# Patient Record
Sex: Male | Born: 2005
Health system: Southern US, Community
[De-identification: ages and names within clinical notes are randomized; demographics above are authoritative.]

## PROBLEM LIST (undated history)

## (undated) DIAGNOSIS — F909 Attention-deficit hyperactivity disorder, unspecified type: Secondary | ICD-10-CM

## (undated) DIAGNOSIS — J45909 Unspecified asthma, uncomplicated: Secondary | ICD-10-CM

## (undated) DIAGNOSIS — J302 Other seasonal allergic rhinitis: Secondary | ICD-10-CM

## (undated) DIAGNOSIS — T7840XA Allergy, unspecified, initial encounter: Secondary | ICD-10-CM

## (undated) HISTORY — PX: HYPOSPADIAS CORRECTION: SHX483

## (undated) HISTORY — DX: Allergy, unspecified, initial encounter: T78.40XA

## (undated) HISTORY — DX: Unspecified asthma, uncomplicated: J45.909

---
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
## 2023-04-16 ENCOUNTER — Ambulatory Visit: Payer: 59 | Admitting: Family Medicine

## 2023-04-22 ENCOUNTER — Encounter (INDEPENDENT_AMBULATORY_CARE_PROVIDER_SITE_OTHER): Payer: Self-pay

## 2023-06-01 ENCOUNTER — Other Ambulatory Visit (HOSPITAL_COMMUNITY)
Admission: RE | Admit: 2023-06-01 | Discharge: 2023-06-01 | Disposition: A | Discharge status: Home / Self Care | Payer: 59 | Source: Ambulatory Visit | Attending: Family Medicine | Admitting: Family Medicine

## 2023-06-01 ENCOUNTER — Ambulatory Visit: Payer: 59 | Admitting: Family Medicine

## 2023-06-01 ENCOUNTER — Encounter: Payer: Self-pay | Admitting: Family Medicine

## 2023-06-01 VITALS — BP 110/70 | HR 61 | Temp 97.6°F | Ht 67.1 in | Wt 209.6 lb

## 2023-06-01 DIAGNOSIS — Z118 Encounter for screening for other infectious and parasitic diseases: Secondary | ICD-10-CM | POA: Insufficient documentation

## 2023-06-01 DIAGNOSIS — R413 Other amnesia: Secondary | ICD-10-CM

## 2023-06-01 DIAGNOSIS — Z113 Encounter for screening for infections with a predominantly sexual mode of transmission: Secondary | ICD-10-CM

## 2023-06-01 DIAGNOSIS — R6889 Other general symptoms and signs: Secondary | ICD-10-CM

## 2023-06-01 DIAGNOSIS — Z114 Encounter for screening for human immunodeficiency virus [HIV]: Secondary | ICD-10-CM

## 2023-06-01 NOTE — Patient Instructions (Addendum)
Please stop by lab before you go If you have mychart- we will send your results within 3 business days of [place] receiving them.  If you do not have mychart- we will call you about results within 5 business days of [place] receiving them.  *please also note that you will see labs on mychart as soon as they post. I will later go in and write notes on them- will say "notes from Dr. Durene Cal"   We have placed a referral for you today to [facility] ophthalmology (please confirm with your insurance that they are covered). In some cases you will see # listed below- you can call this if you have not heard within a week. If you do not see # listed- you should receive a mychart message or phone call within a week with the # to call directly- call that as soon as you get it. If you are having issues getting scheduled reach out to [place] again.   Recommended follow up: Return for next already scheduled visit or sooner if needed.

## 2023-06-01 NOTE — Addendum Note (Signed)
Addended by: Shelva Majestic on: 06/01/2023 04:08 PM   Modules accepted: Orders

## 2023-06-01 NOTE — Progress Notes (Addendum)
Phone [phone] In person visit   Subjective:   Jonathan Robertson is a 17 y.o. year old very pleasant male patient who presents for/with See problem oriented charting Chief Complaint  Patient presents with   memory issues    Pt c/o short term memory loss    Past Medical History-  Patient Active Problem List   Diagnosis Date Noted   ADHD (attention deficit hyperactivity disorder)     Priority: High   Asthma     Priority: Medium    Seasonal allergies     Priority: Low    Medications- reviewed and updated Current Outpatient Medications  Medication Sig Dispense Refill   ASHWAGANDHA PO Take by mouth.     Cholecalciferol (VITAMIN D3) 25 MCG (1000 UT) CAPS Take by mouth.     Magnesium 400 MG CAPS Take by mouth.     EPINEPHrine 0.3 mg/0.3 mL IJ SOAJ injection Inject 0.3 mg into the muscle as needed for anaphylaxis (for been stings). (Patient not taking: Reported on 06/01/2023) 2 each 1   No current facility-administered medications for this visit.     Objective:  BP 110/70   Pulse 61   Temp 97.6 F (36.4 C)   Ht 5' 7.1" (1.704 m)   Wt 209 lb 9.6 oz (95.1 kg)   SpO2 96%   BMI 32.73 kg/m  Gen: NAD, resting comfortably CV: RRR no murmurs rubs or gallops Lungs: CTAB no crackles, wheeze, rhonchi Ext: no edema Skin: warm, dry Neuro: CN II-XII intact, sensation and reflexes normal throughout, 5/5 muscle strength in bilateral upper and lower extremities. Normal finger to nose. Normal rapid alternating movements. No pronator drift. Normal romberg. Normal gait.      Assessment and Plan   #social update- breakup about 2.5 months ago with girlfriend was hard but he is doing better now  # Memory loss S: patient complains of ongoing issues with short term memory loss  - he does have known but untreated attention deficit disorder -denies issues with long term memory. Usually placing an object down and forgetting where or forgetting where he was in a specific task (like walking  into a room) -takes B12 - noted issues a few years ago but didn't remember to say anything. Stable without worsening. Has also been off add medications for a few years - RPR and HIV negative last visit -General Anxiety Disorder - 7 question screening survey of 14- some stress could contribute. Phq2 of 0.   -No regular issues with any of the following-  facial or extremity weakness. No slurred words or trouble swallowing. no blurry vision or double vision. No paresthesias. No confusion or word finding difficulties. No headache(s)   A/P: some memory loss issues- reassuring neurological exam and minicog with normal clock draw and 2/3 recall - normal screening on memory. Likely due to underlying attention deficit disorder as far as memory concerns but we will update labs -hold off on MRI of the brain- think likely low yield at his age unless progression.    # Obesity- peak 247 on home scales  S:got as low as 201- exercise 5 days a week and has tried to eat better  Wt Readings from Last 3 Encounters:  06/01/23 209 lb 9.6 oz (95.1 kg) (95%, Z= 1.68)*  09/21/22 216 lb 6.4 oz (98.2 kg) (97%, Z= 1.87)*  04/03/22 222 lb 12.8 oz (101.1 kg) (98%, Z= 2.04)*   * Growth percentiles are based on CDC (Boys, 2-20 Years) data.  A/P: obesity improving-  congratulated his efforts and encouraged him to continue!   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   -recommended getting his eyes evaluated with eye doctor as first step with changes in sensitivity  Recommended follow up: Return for next already scheduled visit or sooner if needed. Future Appointments  Date Time Provider Department Center  09/23/2023 10:00 AM Shelva Majestic, MD [facility]   Lab/Order associations:   ICD-10-CM   1. Memory loss  R41.3 Vitamin B12    TSH    2. Light sensitivity  [id number] Ambulatory referral to Ophthalmology    3. Screening for HIV  (human immunodeficiency virus)  Z11.4 HIV Antibody (routine testing w rflx)    4. Screening examination for venereal disease  Z11.3 RPR    5. Screening for gonorrhea  Z11.3 Urine cytology ancillary only    6. Screening for chlamydial disease  Z11.8 Urine cytology ancillary only     Time Spent: 22 minutes of total time (3:44 PM- 4:06 PM) was spent on the date of the encounter performing the following actions:  obtaining history, performing a medically necessary exam, counseling on the treatment plan and discussing how attention deficit disorder can affect memory, placing orders, and documenting in our EHR.   No orders of the defined types were placed in this encounter.   Return precautions advised.  Tana Conch, MD

## 2023-06-02 LAB — TSH: TSH: 1.68 u[IU]/mL (ref 0.40–5.00)

## 2023-06-02 LAB — RPR: RPR Ser Ql: NONREACTIVE

## 2023-06-02 LAB — VITAMIN B12: Vitamin B-12: 805 pg/mL (ref 211–911)

## 2023-06-02 LAB — HIV ANTIBODY (ROUTINE TESTING W REFLEX): HIV 1&2 Ab, 4th Generation: NONREACTIVE

## 2023-06-03 LAB — URINE CYTOLOGY ANCILLARY ONLY
Chlamydia: NEGATIVE
Comment: NEGATIVE
Comment: NEGATIVE
Comment: NORMAL
Neisseria Gonorrhea: NEGATIVE
Trichomonas: NEGATIVE

## 2023-09-23 ENCOUNTER — Encounter: Payer: BLUE CROSS/BLUE SHIELD | Admitting: Family Medicine

## 2023-10-07 ENCOUNTER — Other Ambulatory Visit: Payer: Self-pay

## 2023-10-07 ENCOUNTER — Emergency Department (HOSPITAL_BASED_OUTPATIENT_CLINIC_OR_DEPARTMENT_OTHER)
Admission: EM | Admit: 2023-10-07 | Discharge: 2023-10-08 | Disposition: A | Discharge status: Home / Self Care | Payer: BLUE CROSS/BLUE SHIELD | Attending: Emergency Medicine | Admitting: Emergency Medicine

## 2023-10-07 ENCOUNTER — Emergency Department (HOSPITAL_BASED_OUTPATIENT_CLINIC_OR_DEPARTMENT_OTHER): Payer: BLUE CROSS/BLUE SHIELD | Admitting: Radiology

## 2023-10-07 ENCOUNTER — Encounter (HOSPITAL_BASED_OUTPATIENT_CLINIC_OR_DEPARTMENT_OTHER): Payer: Self-pay | Admitting: Emergency Medicine

## 2023-10-07 DIAGNOSIS — Z20822 Contact with and (suspected) exposure to covid-19: Secondary | ICD-10-CM | POA: Diagnosis not present

## 2023-10-07 DIAGNOSIS — Z572 Occupational exposure to dust: Secondary | ICD-10-CM | POA: Diagnosis not present

## 2023-10-07 DIAGNOSIS — R519 Headache, unspecified: Secondary | ICD-10-CM | POA: Diagnosis not present

## 2023-10-07 DIAGNOSIS — R059 Cough, unspecified: Secondary | ICD-10-CM | POA: Insufficient documentation

## 2023-10-07 LAB — RESP PANEL BY RT-PCR (RSV, FLU A&B, COVID)  RVPGX2
Influenza A by PCR: NEGATIVE
Influenza B by PCR: NEGATIVE
Resp Syncytial Virus by PCR: NEGATIVE
SARS Coronavirus 2 by RT PCR: NEGATIVE

## 2023-10-07 MED ORDER — AEROCHAMBER PLUS FLO-VU LARGE MISC
1.0000 | Freq: Once | Status: AC
  Administered 2023-10-08: 1

## 2023-10-07 MED ORDER — ALBUTEROL SULFATE HFA 108 (90 BASE) MCG/ACT IN AERS
1.0000 | INHALATION_SPRAY | Freq: Once | RESPIRATORY_TRACT | Status: AC
  Administered 2023-10-08: 2 via RESPIRATORY_TRACT
  Filled 2023-10-07: qty 6.7

## 2023-10-07 NOTE — ED Provider Notes (Signed)
[place] EMERGENCY DEPARTMENT AT [facility] Provider Note   CSN: [id number] Arrival date & time: 10/07/23  2221     History  Chief Complaint  Patient presents with   Cough    Jonathan Robertson is a 18 y.o. male presents today for cough, headaches, and loss of smell since last Friday.  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  Patient endorses coughing gray tinge mucus.  Patient denies fever, shortness of breath, fever, nausea, vomiting, abdominal pain, or chest pain.   Cough Associated symptoms: headaches        Home Medications Prior to Admission medications   Medication Sig Start Date End Date Taking? Authorizing Provider  ASHWAGANDHA PO Take by mouth.    [provider]  Cholecalciferol (VITAMIN D3) 25 MCG (1000 UT) CAPS Take by mouth.    [provider]  EPINEPHrine 0.3 mg/0.3 mL IJ SOAJ injection Inject 0.3 mg into the muscle as needed for anaphylaxis (for been stings). Patient not taking: Reported on 06/01/2023 03/07/21   Shelva Majestic, MD  Magnesium 400 MG CAPS Take by mouth.    [provider]      Allergies    Patient has no known allergies.    Review of Systems   Review of Systems  Respiratory:  Positive for cough.   Neurological:  Positive for headaches.    Physical Exam Updated Vital Signs There were no vitals taken for this visit. Physical Exam Vitals and nursing note reviewed.  Constitutional:      General: He is not in acute distress.    Appearance: Normal appearance. He is well-developed. He is not ill-appearing or toxic-appearing.  HENT:     Head: Normocephalic and atraumatic.     Right Ear: External ear normal.     Left Ear: External ear normal.     Nose: Congestion present. No rhinorrhea.     Mouth/Throat:     Mouth: Mucous membranes are moist.     Pharynx: Oropharynx is clear.  Eyes:     Extraocular Movements: Extraocular  movements intact.     Conjunctiva/sclera: Conjunctivae normal.  Cardiovascular:     Rate and Rhythm: Normal rate and regular rhythm.     Pulses: Normal pulses.     Heart sounds: Normal heart sounds. No murmur heard. Pulmonary:     Effort: Pulmonary effort is normal. No respiratory distress.     Breath sounds: Normal breath sounds.  Abdominal:     Palpations: Abdomen is soft.  Musculoskeletal:        General: No swelling.     Cervical back: Neck supple.  Skin:    General: Skin is warm and dry.     Capillary Refill: Capillary refill takes less than 2 seconds.  Neurological:     General: No focal deficit present.     Mental Status: He is alert.     Motor: No weakness.  Psychiatric:        Mood and Affect: Mood normal.     ED Results / Procedures / Treatments   Labs (all labs ordered are listed, but only abnormal results are displayed) Labs Reviewed  RESP PANEL BY RT-PCR (RSV, FLU A&B, COVID)  [id number]    EKG None  Radiology DG Chest 2 View Result Date: 10/07/2023 CLINICAL DATA:  Cough after exposure to metal debris EXAM: CHEST - 2 VIEW COMPARISON:  Chest x-ray 10/06/2017 FINDINGS: The heart  and mediastinal contours are within normal limits. No focal consolidation. No pulmonary edema. No pleural effusion. No pneumothorax. No acute osseous abnormality. IMPRESSION: No active cardiopulmonary disease. Electronically Signed   By: Tish Frederickson M.D.   On: 10/07/2023 23:02    Procedures Procedures    Medications Ordered in ED Medications  albuterol (VENTOLIN HFA) 108 (90 Base) MCG/ACT inhaler 1-2 puff (has no administration in time range)    ED Course/ Medical Decision Making/ A&P                                 Medical Decision Making Amount and/or Complexity of Data Reviewed Radiology: ordered.   This patient presents to the ED with chief complaint(s) of cough, headache, loss of smell with pertinent past medical history of asthma which further complicates the  presenting complaint. The complaint involves an extensive differential diagnosis and also carries with it a high risk of complications and morbidity.    The differential diagnosis includes COVID, flu, RSV, URI, pneumonia, asthma exacerbation, irritant induced airway reaction  Additional history obtained: Additional history obtained from significant other Records reviewed Primary Care Documents  ED Course and Reassessment: Patient given albuterol inhaler as patient has past medical history significant for asthma and does not currently have an inhaler  Independent labs interpretation:  The following labs were independently interpreted:  Respiratory panel: Negative  Independent visualization of imaging: - I independently visualized the following imaging with scope of interpretation limited to determining acute life threatening conditions related to emergency care: Chest x-ray, which revealed no active cardiopulmonary disease  Consultation: - Consulted or discussed management/test interpretation w/ external professional: None  Consideration for admission or further workup: Considered for mission further workup however patient vital signs, physical exam, labs, and imaging were reassuring.  Patient symptoms likely due to dust exposure at work causing airway irritation.  Patient encouraged to take an antihistamine over the upcoming weeks and follow-up with his primary care physician if his symptoms do not resolve.        Final Clinical Impression(s) / ED Diagnoses Final diagnoses:  Occupational exposure to dust    Rx / DC Orders ED Discharge Orders     None         Gretta Began 10/07/23 2352    Rondel Baton, MD 10/11/23 [phone]

## 2023-10-07 NOTE — ED Triage Notes (Signed)
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 Coughing since, headaches, loss of smell.

## 2023-10-07 NOTE — Discharge Instructions (Addendum)
Today you are seen for cough and headache likely due to dust exposure at your job.  Please take an antihistamine for the upcoming weeks to see your symptoms resolved. Please follow-up with your primary care if your symptoms do not resolve for further evaluation.  Thank you for letting us treat you today. After reviewing your labs and imaging, I feel you are safe to go home. Please follow up with your PCP in the next several days and provide them with your records from this visit. Return to the Emergency Room if pain becomes severe or symptoms worsen.

## 2023-10-08 DIAGNOSIS — R059 Cough, unspecified: Secondary | ICD-10-CM | POA: Diagnosis not present

## 2023-10-08 NOTE — ED Notes (Signed)
RT educated pt on proper use of MDI w/spacer. Pt able to perform w/out difficulty. Pt verbalizes understanding of dosing and administration instructions.     10/08/23 0002  Aerosol Therapy Tx  Medications Albuterol  Delivery Device MDI (spacer)  Pre-Treatment Pulse 77  Pre-Treatment Respirations 18  Treatment Tolerance Tolerated well  Treatment Given 1  MEWS Score/Color  MEWS Score 0  MEWS Score Color Green  Oxygen Therapy/Pulse Ox  SpO2 99 %

## 2023-10-18 ENCOUNTER — Encounter: Payer: Self-pay | Admitting: Family Medicine

## 2023-10-18 ENCOUNTER — Ambulatory Visit (INDEPENDENT_AMBULATORY_CARE_PROVIDER_SITE_OTHER): Payer: BLUE CROSS/BLUE SHIELD | Admitting: Family Medicine

## 2023-10-18 VITALS — BP 118/66 | HR 66 | Temp 98.4°F | Ht 69.0 in | Wt 210.8 lb

## 2023-10-18 DIAGNOSIS — J329 Chronic sinusitis, unspecified: Secondary | ICD-10-CM | POA: Diagnosis not present

## 2023-10-18 DIAGNOSIS — B9689 Other specified bacterial agents as the cause of diseases classified elsewhere: Secondary | ICD-10-CM

## 2023-10-18 MED ORDER — AMOXICILLIN-POT CLAVULANATE 875-125 MG PO TABS: 1.0000 | ORAL_TABLET | Freq: Two times a day (BID) | ORAL | 0 refills | Status: AC

## 2023-10-18 NOTE — Patient Instructions (Addendum)
 Health Maintenance Due  Topic Date Due   Pneumococcal Vaccine 22-20 Years old (1 of 2 - PCV) 03/11/2010  When you start feeling better  I was initially concerned that asthma may be flared but lungs are clear and he has not used albuterol  today- on exam nose looks irritated and he has sinus pressure and with ongoing blowing out discharge with over 2 weeks duration we opted to treat for bacterial sinusitis but if not significantly improved within a week he should let me know or sooner if symptoms worsen.    Recommended follow up: Return for as needed for new, worsening, persistent symptoms.

## 2023-10-18 NOTE — Progress Notes (Signed)
 Phone [phone] In person visit   Subjective:   Jonathan Robertson is a 18 y.o. year old very pleasant male patient who presents for/with See problem oriented charting Chief Complaint  Patient presents with   Follow-up   Past Medical History-  Patient Active Problem List   Diagnosis Date Noted   ADHD (attention deficit hyperactivity disorder)     Priority: High   Asthma     Priority: Medium    Seasonal allergies     Priority: Low    Medications- reviewed and updated Current Outpatient Medications  Medication Sig Dispense Refill   albuterol  (VENTOLIN  HFA) 108 (90 Base) MCG/ACT inhaler Inhale 1-2 puffs into the lungs every 4 (four) hours as needed for wheezing or shortness of breath.     amoxicillin -clavulanate (AUGMENTIN ) 875-125 MG tablet Take 1 tablet by mouth 2 (two) times daily for 7 days. 14 tablet 0   Cholecalciferol (VITAMIN D3) 25 MCG (1000 UT) CAPS Take by mouth.     EPINEPHrine  0.3 mg/0.3 mL IJ SOAJ injection Inject 0.3 mg into the muscle as needed for anaphylaxis (for been stings). 2 each 1   Magnesium 400 MG CAPS Take by mouth.     ASHWAGANDHA PO Take by mouth. (Patient not taking: Reported on 10/18/2023)     No current facility-administered medications for this visit.     Objective:  BP 118/66 (BP Location: Left Arm, Patient Position: Sitting, Cuff Size: Normal)   Pulse 66   Temp 98.4 F (36.9 C) (Temporal)   Ht 5\' 9"  (1.753 m)   Wt 210 lb 12.8 oz (95.6 kg)   SpO2 94%   BMI 31.13 kg/m  oxygen usually around 96% Gen: NAD, resting comfortably Nasal erythema and edema noted with some yellow discharge.  Tympanic membrane's normal bilaterally.  Oropharynx largely normal.  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 He immediately started coughing and he has had dark green/black mucus and sometimes blood. Feels like it is in his throat.    Went to Emergency Department about 10 days ago and had respiratory panel negative including covd, flu, RSV and CXR reassuring   He reports symptoms are improving in last few days- deep cough, some wheezing, seems to work harder to breath while sleeping- has bene observed by girlfriend. Migraines flared at first but those are better. Prior blood in sputum much better perhaps 85% better. Albuterol  about once a day and helps. Shortness of breath only with coughing spells- does well otherwise. Blowing out discharge. Some sinus tenderness A/P: I was initially concerned that asthma may be flared but lungs are clear and he has not used albuterol  today- on exam nose looks irritated and he has sinus pressure and with ongoing blowing out discharge with over 2 weeks duration we opted to treat for bacterial sinusitis but if not significantly improved within a week he should let me know or sooner if symptoms worsen.   -With prior dust exposure also strongly consider prednisone -may do that as next step  Recommended follow up: Return for as needed for new, worsening, persistent symptoms. Future Appointments  Date Time Provider Department Center  03/17/2024  2:20 PM Almira Jaeger,  MD [facility]    Lab/Order associations:   ICD-10-CM   1. Bacterial sinusitis  J32.9    B96.89       Meds ordered this encounter  Medications   amoxicillin -clavulanate (AUGMENTIN ) 875-125 MG tablet    Sig: Take 1 tablet by mouth 2 (two) times daily for 7 days.    Dispense:  14 tablet    Refill:  0    Return precautions advised.  Clarisa Crooked, MD

## 2024-03-17 ENCOUNTER — Encounter: Payer: Self-pay | Admitting: Family Medicine

## 2024-03-17 ENCOUNTER — Ambulatory Visit (INDEPENDENT_AMBULATORY_CARE_PROVIDER_SITE_OTHER): Payer: BLUE CROSS/BLUE SHIELD | Admitting: Family Medicine

## 2024-03-17 VITALS — BP 130/64 | HR 77 | Temp 97.8°F | Ht 69.0 in | Wt 224.6 lb

## 2024-03-17 DIAGNOSIS — E669 Obesity, unspecified: Secondary | ICD-10-CM

## 2024-03-17 DIAGNOSIS — Z0000 Encounter for general adult medical examination without abnormal findings: Secondary | ICD-10-CM | POA: Diagnosis not present

## 2024-03-17 NOTE — Patient Instructions (Addendum)
 Schedule dentist follow up   Get weight back down and restart your regular exercise  Recommended follow up: Return in about 1 year (around 03/17/2025) for physical or sooner if needed.Schedule b4 you leave.

## 2024-03-17 NOTE — Progress Notes (Signed)
 Phone: [phone]    Subjective:  Patient presents today for their annual physical. Chief complaint-noted.   See problem oriented charting- ROS- full  review of systems was completed and negative  Per full ROS sheet completed by patient except for topics noted under acute/chronic concerns  The following were reviewed and entered/updated in epic: Past Medical History:  Diagnosis Date   ADHD (attention deficit hyperactivity disorder)    concerta 36mg . treated by pediatrician   Allergy    Asthma    Seasonal allergies    xyzal  5mg  and singulair .   Patient Active Problem List   Diagnosis Date Noted   ADHD (attention deficit hyperactivity disorder)     Priority: High   Asthma     Priority: Medium    Seasonal allergies     Priority: Low   Past Surgical History:  Procedure Laterality Date   HYPOSPADIAS CORRECTION     at age 63 and age 20    Family History  Problem Relation Age of Onset   COPD Mother    Cancer - Prostate Father        56   Healthy Sister    Healthy Sister    COPD Maternal Grandmother    Asthma Maternal Grandmother    COPD Maternal Grandfather    Other Paternal Grandmother        unknown   Other Paternal Grandfather        unknown   Prostate cancer Paternal Uncle        age 37    Medications- reviewed and updated Current Outpatient Medications  Medication Sig Dispense Refill   albuterol  (VENTOLIN  HFA) 108 (90 Base) MCG/ACT inhaler Inhale 1-2 puffs into the lungs every 4 (four) hours as needed for wheezing or shortness of breath.     ASHWAGANDHA PO Take by mouth.     Cholecalciferol (VITAMIN D3) 25 MCG (1000 UT) CAPS Take by mouth.     EPINEPHrine  0.3 mg/0.3 mL IJ SOAJ injection Inject 0.3 mg into the muscle as needed for anaphylaxis (for been stings). 2 each 1   Magnesium 400 MG CAPS Take by mouth.     No current facility-administered medications for this visit.    Allergies-reviewed and updated No Known Allergies  Social History   Social  History Narrative   Living with his dad full time now in 2024. mom samantha brady. Now deceased- Merlynn brady is step grandmom. Ron brady is grandfather.       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 Oropharynx normal Neck: no thyromegaly CV: RRR no murmurs rubs or gallops Lungs: CTAB no crackles, wheeze, rhonchi Abdomen: soft/nontender/nondistended/normal bowel sounds. No rebound or guarding.  Ext: no edema Skin: warm, dry Neuro: grossly normal, moves all extremities, PERRLA     Assessment and Plan:  18 y.o. male presenting for annual physical.  Health Maintenance counseling: 1. Anticipatory guidance: Patient counseled regarding regular dental exams - advised q6 months, eye exams - no vision issues and uses eye protection with work,  avoiding smoking and second hand smoke , limiting alcohol to 2 beverages per day- doesn't drink, no illicit  drugs.   2. Risk factor reduction:  Advised patient of need for regular exercise and diet rich and fruits and vegetables to reduce risk of heart attack and stroke.  Exercise-4-5 days a week last year and plans to restart - was doing strength and cardio Diet/weight management-weight up 8 lbs from last year but still down from 227 noted in 2023- plans to reduce calorie consumption to help.  Wt Readings from Last 3 Encounters:  03/17/24 224 lb 9.6 oz (101.9 kg)  10/18/23 210 lb 12.8 oz (95.6 kg) (95%, Z= 1.68)*  06/01/23 209 lb 9.6 oz (95.1 kg) (95%, Z= 1.68)*   * Growth percentiles are based on CDC (Boys, 2-20 Years) data.   3. Immunizations/screenings/ancillary studies- has had 2 meningococcal acwy shots- does not need repeat- team trying to update chart Immunization History  Administered  Date(s) Administered   DTaP 05/21/2004, 07/16/2004, 09/17/2004, 09/21/2005   DTaP / IPV 09/18/2008   HIB (PRP-T) 05/21/2004, 07/16/2004, 03/12/2005   HPV 9-valent 03/27/2016, 01/24/2018   Hepatitis A 09/21/2005, 03/14/2007   Hepatitis A, Ped/Adol-2 Dose 09/21/2005, 03/14/2007   Hepatitis B 11/16/03, 04/17/2004, 12/18/2004   IPV 05/21/2004, 07/16/2004, 09/17/2004   MMR 03/12/2005, 08/07/2008   Meningococcal Conjugate 04/30/2015   Meningococcal Mcv4o 04/23/2021   Pneumococcal Conjugate PCV 7 05/21/2004, 07/16/2004, 09/17/2004, 03/12/2005   Pneumococcal-Unspecified 05/21/2004, 07/16/2004, 09/17/2004, 03/12/2005   Tdap 05/16/2014   Varicella 03/12/2005, 08/07/2008  4. Prostate cancer screening- family history in dad of prostate cancer in 64's- we will start him at age 1 with screening   5. Colon cancer screening - no family history, start at age 79  6. Skin cancer screening/prevention- no dermatology. advised regular sunscreen use. Denies worrisome, changing, or new skin lesions.  7. Testicular cancer screening- advised monthly self exams  8. STD screening- patient opts out- long term monogamous girlfriend  49. Smoking associated screening- never smoker  Status of chronic or acute concerns   #dyslipidemia- slightly low HDL- hopefully increasing exercise and improved diet may help this. LDL has been excellent  #offered diabetes screening- opts out  #asthma- no recent issues- may have grown out of this or could have been from smoke exposure  #attention deficit disorder- prefer to be off medicine  #allergies- only as needed medications  Recommended follow up: Return in about 1 year (around 03/17/2025) for physical or sooner if needed.Schedule b4 you leave.  Lab/Order associations:NOT fasting   ICD-10-CM   1. Preventative health care  Z00.00     2. Obesity (BMI 30-39.9)  E66.9       No orders of the defined types were placed in this encounter.   Return precautions advised.    Garnette Lukes, MD

## 2024-10-12 ENCOUNTER — Telehealth: Payer: Self-pay | Admitting: Family Medicine

## 2024-10-12 NOTE — Telephone Encounter (Signed)
 LVM to r/s appt from 03/22/25 for physical

## 2025-03-22 ENCOUNTER — Encounter: Admitting: Family Medicine
